# Patient Record
Sex: Male | Born: 1955 | Race: White | Hispanic: No | Marital: Married | Smoking: Never smoker
Health system: Southern US, Community
[De-identification: ages and names within clinical notes are randomized; demographics above are authoritative.]

## PROBLEM LIST (undated history)

## (undated) DIAGNOSIS — G250 Essential tremor: Secondary | ICD-10-CM

## (undated) DIAGNOSIS — I10 Essential (primary) hypertension: Secondary | ICD-10-CM

## (undated) DIAGNOSIS — E119 Type 2 diabetes mellitus without complications: Secondary | ICD-10-CM

## (undated) DIAGNOSIS — F339 Major depressive disorder, recurrent, unspecified: Secondary | ICD-10-CM

## (undated) DIAGNOSIS — N189 Chronic kidney disease, unspecified: Secondary | ICD-10-CM

## (undated) DIAGNOSIS — I509 Heart failure, unspecified: Secondary | ICD-10-CM

## (undated) DIAGNOSIS — F411 Generalized anxiety disorder: Secondary | ICD-10-CM

---

## 2018-10-21 ENCOUNTER — Other Ambulatory Visit: Payer: Self-pay

## 2018-10-21 ENCOUNTER — Emergency Department
Admission: EM | Admit: 2018-10-21 | Discharge: 2018-10-21 | Disposition: A | Discharge status: Home / Self Care | Payer: 59 | Attending: Emergency Medicine | Admitting: Emergency Medicine

## 2018-10-21 DIAGNOSIS — R41840 Attention and concentration deficit: Secondary | ICD-10-CM | POA: Diagnosis not present

## 2018-10-21 DIAGNOSIS — R4182 Altered mental status, unspecified: Secondary | ICD-10-CM | POA: Diagnosis present

## 2018-10-21 HISTORY — DX: Type 2 diabetes mellitus without complications: E11.9

## 2018-10-21 HISTORY — DX: Chronic kidney disease, unspecified: N18.9

## 2018-10-21 HISTORY — DX: Essential tremor: G25.0

## 2018-10-21 HISTORY — DX: Heart failure, unspecified: I50.9

## 2018-10-21 HISTORY — DX: Major depressive disorder, recurrent, unspecified: F33.9

## 2018-10-21 HISTORY — DX: Essential (primary) hypertension: I10

## 2018-10-21 HISTORY — DX: Generalized anxiety disorder: F41.1

## 2018-10-21 LAB — BASIC METABOLIC PANEL
Anion gap: 14 (ref 5–15)
BUN: 32 mg/dL — ABNORMAL HIGH (ref 8–23)
CO2: 26 mmol/L (ref 22–32)
Calcium: 8.7 mg/dL — ABNORMAL LOW (ref 8.9–10.3)
Chloride: 96 mmol/L — ABNORMAL LOW (ref 98–111)
Creatinine, Ser: 1.99 mg/dL — ABNORMAL HIGH (ref 0.61–1.24)
GFR calc Af Amer: 40 mL/min — ABNORMAL LOW (ref >60)
GFR calc non Af Amer: 34 mL/min — ABNORMAL LOW (ref >60)
Glucose, Bld: 92 mg/dL (ref 70–99)
Potassium: 3.8 mmol/L (ref 3.5–5.1)
Sodium: 136 mmol/L (ref 135–145)

## 2018-10-21 LAB — CBC WITH DIFFERENTIAL/PLATELET
Abs Immature Granulocytes: 0.02 10*3/uL (ref 0.00–0.07)
Basophils Absolute: 0 10*3/uL (ref 0.0–0.1)
Basophils Relative: 0 %
Eosinophils Absolute: 0.1 10*3/uL (ref 0.0–0.5)
Eosinophils Relative: 2 %
HCT: 37.5 % — ABNORMAL LOW (ref 39.0–52.0)
Hemoglobin: 12.4 g/dL — ABNORMAL LOW (ref 13.0–17.0)
Immature Granulocytes: 0 %
Lymphocytes Relative: 26 %
Lymphs Abs: 1.9 10*3/uL (ref 0.7–4.0)
MCH: 30 pg (ref 26.0–34.0)
MCHC: 33.1 g/dL (ref 30.0–36.0)
MCV: 90.6 fL (ref 80.0–100.0)
Monocytes Absolute: 0.8 10*3/uL (ref 0.1–1.0)
Monocytes Relative: 11 %
Neutro Abs: 4.3 10*3/uL (ref 1.7–7.7)
Neutrophils Relative %: 61 %
Platelets: 209 10*3/uL (ref 150–400)
RBC: 4.14 MIL/uL — ABNORMAL LOW (ref 4.22–5.81)
RDW: 12.8 % (ref 11.5–15.5)
WBC: 7.2 10*3/uL (ref 4.0–10.5)
nRBC: 0 % (ref 0.0–0.2)

## 2018-10-21 NOTE — ED Provider Notes (Signed)
[facility] Emergency Department Provider Note   ____________________________________________   I have reviewed the triage vital signs and the nursing notes.   HISTORY  Chief Complaint Abnormal behavior  History limited by: Not Limited   HPI Nicholas Morgan is a 63 y.o. male who presents to the emergency department today because of concern for altered mental status and increased swelling. The patient denies any complaints. He says that the bump on his head is from birth and that at times it will get larger. He denies any discomfort. Staff also noticed that when they were talking to the patient he would occasionally have episodes where he would seem to not respond for a couple of seconds. Patient himself does not recall any lapses in concentration.  Records reviewed. Per medical record review patient has a history of diabetes  No past medical history on file.  There are no active problems to display for this patient.   Prior to Admission medications   Not on File    Allergies Patient has no allergy information on record.  No family history on file.  Social History Social History   Tobacco Use  . Smoking status: Not on file  Substance Use Topics  . Alcohol use: Not on file  . Drug use: Not on file    Review of Systems Constitutional: No fever/chills Eyes: No visual changes. ENT: No sore throat. Cardiovascular: Denies chest pain. Respiratory: Denies shortness of breath. Gastrointestinal: No abdominal pain.  No nausea, no vomiting.  No diarrhea.   Genitourinary: Negative for dysuria. Musculoskeletal: Negative for back pain. Skin: Negative for rash. Neurological: Negative for headaches, focal weakness or numbness.  ____________________________________________   PHYSICAL EXAM:  VITAL SIGNS: ED Triage Vitals  Enc Vitals Group     BP 10/21/18 0213 140/85     Pulse Rate 10/21/18 0215 (!) 54     Resp 10/21/18 0216 20     Temp 10/21/18 0216  97.6 F (36.4 C)     Temp Source 10/21/18 0216 Oral     SpO2 10/21/18 0215 97 %     Weight 10/21/18 0217 (!) 307 lb 15.7 oz (139.7 kg)     Height 10/21/18 0217 6\' 1"  (1.854 m)     Head Circumference --      Peak Flow --      Pain Score 10/21/18 0219 0   Constitutional: Alert and oriented.  Eyes: Conjunctivae are normal.  ENT      Head: Normocephalic and atraumatic.      Nose: No congestion/rhinnorhea.      Mouth/Throat: Mucous membranes are moist.      Neck: No stridor. Hematological/Lymphatic/Immunilogical: No cervical lymphadenopathy. Cardiovascular: Normal rate, regular rhythm.  No murmurs, rubs, or gallops.  Respiratory: Normal respiratory effort without tachypnea nor retractions. Breath sounds are clear and equal bilaterally. No wheezes/rales/rhonchi. Gastrointestinal: Soft and non tender. No rebound. No guarding.  Genitourinary: Deferred Musculoskeletal: Normal range of motion in all extremities. No lower extremity edema. Neurologic:  Normal speech and language. No gross focal neurologic deficits are appreciated.  Skin:  Skin is warm, dry and intact. No rash noted. Psychiatric: Mood and affect are normal. Speech and behavior are normal. Patient exhibits appropriate insight and judgment.  ____________________________________________    LABS (pertinent positives/negatives)  CBC wbc 7.2, hgb 12.4, plt 209 BMP na 136, k 3.8, cr 1.99 ____________________________________________   EKG  None  ____________________________________________    RADIOLOGY  None  ____________________________________________   PROCEDURES  Procedures  ____________________________________________  INITIAL IMPRESSION / ASSESSMENT AND PLAN / ED COURSE  Pertinent labs & imaging results that were available during my care of the patient were reviewed by me and considered in my medical decision making (see chart for details).   Patient presents to the emergency department today because  of concern for brief sounding lapses in concentration. Patient himself denies any complaints. Blood work was checked which showed elevation of creatinine but in review of care everywhere this appears to be patient's baseline. Patient himself has no complaints and no obvious AMS noted here. Will plan on discharging back to living facility. ____________________________________________   FINAL CLINICAL IMPRESSION(S) / ED DIAGNOSES  Final diagnoses:  Disturbed concentration     Note: This dictation was prepared with Dragon dictation. Any transcriptional errors that result from this process are unintentional     Phineas SemenGoodman, Jamaury Gumz, MD 10/21/18 [phone]

## 2018-10-21 NOTE — Discharge Instructions (Addendum)
Please seek medical attention for any high fevers, chest pain, shortness of breath, change in behavior, persistent vomiting, bloody stool or any other new or concerning symptoms.  

## 2018-10-21 NOTE — ED Triage Notes (Signed)
Pt arrived via [facility] from [facility] with no complaints. Staff told EMS that the pt had a mark on his right forehead that looks swollen to them and that they were talking to the pt and he would "zone out" and not respond to them but then refocus and talk to them again. Pt denies any pain. Tremors are normal for pt. Pt also has a metal plate in his head so he cannot have any MRI testing. Pt calm and cooperative.

## 2018-10-29 ENCOUNTER — Emergency Department
Admission: EM | Admit: 2018-10-29 | Discharge: 2018-10-29 | Disposition: A | Discharge status: Home / Self Care | Payer: 59 | Attending: Emergency Medicine | Admitting: Emergency Medicine

## 2018-10-29 ENCOUNTER — Other Ambulatory Visit: Payer: Self-pay

## 2018-10-29 ENCOUNTER — Encounter: Payer: Self-pay | Admitting: Emergency Medicine

## 2018-10-29 ENCOUNTER — Emergency Department: Payer: 59

## 2018-10-29 DIAGNOSIS — Y93E8 Activity, other personal hygiene: Secondary | ICD-10-CM | POA: Diagnosis not present

## 2018-10-29 DIAGNOSIS — W0110XA Fall on same level from slipping, tripping and stumbling with subsequent striking against unspecified object, initial encounter: Secondary | ICD-10-CM | POA: Insufficient documentation

## 2018-10-29 DIAGNOSIS — N189 Chronic kidney disease, unspecified: Secondary | ICD-10-CM | POA: Insufficient documentation

## 2018-10-29 DIAGNOSIS — W19XXXA Unspecified fall, initial encounter: Secondary | ICD-10-CM

## 2018-10-29 DIAGNOSIS — Y999 Unspecified external cause status: Secondary | ICD-10-CM | POA: Diagnosis not present

## 2018-10-29 DIAGNOSIS — I509 Heart failure, unspecified: Secondary | ICD-10-CM | POA: Diagnosis not present

## 2018-10-29 DIAGNOSIS — S0102XA Laceration with foreign body of scalp, initial encounter: Secondary | ICD-10-CM | POA: Diagnosis not present

## 2018-10-29 DIAGNOSIS — Y939 Activity, unspecified: Secondary | ICD-10-CM | POA: Diagnosis not present

## 2018-10-29 DIAGNOSIS — Y929 Unspecified place or not applicable: Secondary | ICD-10-CM | POA: Insufficient documentation

## 2018-10-29 DIAGNOSIS — S0101XA Laceration without foreign body of scalp, initial encounter: Secondary | ICD-10-CM

## 2018-10-29 DIAGNOSIS — I130 Hypertensive heart and chronic kidney disease with heart failure and stage 1 through stage 4 chronic kidney disease, or unspecified chronic kidney disease: Secondary | ICD-10-CM | POA: Diagnosis not present

## 2018-10-29 DIAGNOSIS — S0990XA Unspecified injury of head, initial encounter: Secondary | ICD-10-CM | POA: Diagnosis present

## 2018-10-29 MED ORDER — LIDOCAINE HCL (PF) 1 % IJ SOLN
5.0000 mL | Freq: Once | INTRAMUSCULAR | Status: AC
  Administered 2018-10-29: 5 mL
  Filled 2018-10-29: qty 5

## 2018-10-29 NOTE — ED Notes (Signed)
Pt wife updated regarding pt treatment and d/c status

## 2018-10-29 NOTE — ED Triage Notes (Signed)
Pt arrives via EMS from [facility]. EMS reports fall this am with laceration to the head from hitting head on the wall. No reports of loss of consciousness.  BP 100/58 in route EMS took it again and got 75/49.

## 2018-10-29 NOTE — ED Notes (Signed)
Suture Cart at bedside.  

## 2018-10-29 NOTE — Discharge Instructions (Signed)
Please return to the ED or in urgent care in 1 week for suture removal.  Return to the ED sooner than that for increasing swelling, redness, pain, or fever.

## 2018-10-29 NOTE — ED Notes (Signed)
Report given to facility at this time. Information systems manager states that pt will have to return to facility through EMS. Pt wife states that she was unable to transport pt back to facility as well.

## 2018-10-29 NOTE — ED Provider Notes (Signed)
[facility] Emergency Department Provider Note   ____________________________________________   First MD Initiated Contact with Patient 10/29/18 1217     (approximate)  I have reviewed the triage vital signs and the nursing notes.   HISTORY  Chief Complaint Fall    HPI Nicholas Morgan is a 63 y.o. male with past medical history listed below who presents to the ED following fall.  Patient reports that he was bending over to put on his pants, when he is lost his balance and fell forward onto his head.  He reports hitting his head on the ground, but denies losing consciousness.  He now complains of pain to his frontal scalp as well as to his neck.  He denies any numbness or weakness, is not anticoagulated.        Past Medical History:  Diagnosis Date  . CHF (congestive heart failure) (HCC)   . Chronic kidney disease (CKD)   . Diabetes mellitus without complication (HCC)   . Essential hypertension   . Essential tremor   . Generalized anxiety disorder   . Major depressive disorder, recurrent (HCC)     There are no active problems to display for this patient.   History reviewed. No pertinent surgical history.  Prior to Admission medications   Medication Sig Start Date End Date Taking? Authorizing Provider  amLODipine (NORVASC) 5 MG tablet Take 5 mg by mouth daily.   Yes [provider]  aspirin 81 MG chewable tablet Chew 81 mg by mouth daily.   Yes [provider]  atenolol (TENORMIN) 50 MG tablet Take 50 mg by mouth daily.   Yes [provider]  atorvastatin (LIPITOR) 80 MG tablet Take 80 mg by mouth daily.   Yes [provider]  Brexpiprazole (REXULTI) 1 MG TABS Take 1 mg by mouth at bedtime.   Yes [provider]  buPROPion (WELLBUTRIN XL) 300 MG 24 hr tablet Take 300 mg by mouth daily.   Yes [provider]  cefdinir (OMNICEF) 300 MG capsule Take 300 mg by mouth daily. 10/26/18 11/03/18 Yes  [provider]  Cholecalciferol (VITAMIN D) 50 MCG (2000 UT) CAPS Take 2,000 Units by mouth at bedtime.   Yes [provider]  citalopram (CELEXA) 40 MG tablet Take 40 mg by mouth at bedtime.   Yes [provider]  gabapentin (NEURONTIN) 600 MG tablet Take 600 mg by mouth 2 (two) times daily.   Yes [provider]  losartan (COZAAR) 100 MG tablet Take 100 mg by mouth daily.   Yes [provider]  Omega-3 Fatty Acids (FISH OIL) 1000 MG CAPS Take 1,000 mg by mouth at bedtime.   Yes [provider]  senna (SENOKOT) 8.6 MG TABS tablet Take 1 tablet by mouth daily.   Yes [provider]  tamsulosin (FLOMAX) 0.4 MG CAPS capsule Take 0.4 mg by mouth daily.   Yes [provider]  vitamin B-12 (CYANOCOBALAMIN) 1000 MCG tablet Take 1,000 mcg by mouth at bedtime.   Yes [provider]    Allergies Amantadines and Topamax [topiramate]  History reviewed. No pertinent family history.  Social History Social History   Tobacco Use  . Smoking status: Never Smoker  . Smokeless tobacco: Never Used  Substance Use Topics  . Alcohol use: Not Currently  . Drug use: Never    Review of Systems  Constitutional: No fever/chills Eyes: No visual changes. ENT: No sore throat. Cardiovascular: Denies chest pain. Respiratory: Denies shortness of breath.  Gastrointestinal: No abdominal pain.  No nausea, no vomiting.  No diarrhea.  No constipation. Genitourinary: Negative for dysuria. Musculoskeletal: Negative for back pain. Skin: Negative for rash.  Positive for scalp wound. Neurological: Positive for headache, negative for focal weakness or numbness.  ____________________________________________   PHYSICAL EXAM:  VITAL SIGNS: ED Triage Vitals [10/29/18 1211]  Enc Vitals Group     BP      Pulse      Resp      Temp      Temp src      SpO2      Weight 300 lb (136.1 kg)     Height 6\' 1"  (1.854 m)     Head Circumference       Peak Flow      Pain Score 8     Pain Loc      Pain Edu?      Excl. in GC?     Constitutional: Alert and oriented. Eyes: Conjunctivae are normal. Head: Approximately 2 cm laceration to frontal scalp with no associated hematoma or step-off.  No facial bony tenderness. Nose: No congestion/rhinnorhea. Mouth/Throat: Mucous membranes are moist. Neck: Normal ROM, midline cervical spine tenderness. Cardiovascular: Normal rate, regular rhythm. Grossly normal heart sounds. Respiratory: Normal respiratory effort.  No retractions. Lungs CTAB. Gastrointestinal: Soft and nontender. No distention. Genitourinary: deferred Musculoskeletal: No lower extremity tenderness nor edema. Neurologic:  Normal speech and language. No gross focal neurologic deficits are appreciated. Skin:  Skin is warm, dry and intact. No rash noted. Psychiatric: Mood and affect are normal. Speech and behavior are normal.  ____________________________________________   LABS (all labs ordered are listed, but only abnormal results are displayed)  Labs Reviewed - No data to display   PROCEDURES  Procedure(s) performed (including Critical Care):  Marland KitchenMarland KitchenLaceration Repair  Date/Time: 10/29/2018 4:55 PM Performed by: Chesley Noon, MD Authorized by: Chesley Noon, MD   Consent:    Consent obtained:  Verbal   Consent given by:  Patient Anesthesia (see MAR for exact dosages):    Anesthesia method:  Local infiltration   Local anesthetic:  Lidocaine 1% w/o epi Laceration details:    Location:  Scalp   Scalp location:  Frontal   Length (cm):  2 Repair type:    Repair type:  Simple Pre-procedure details:    Preparation:  Patient was prepped and draped in usual sterile fashion Exploration:    Contaminated: no   Treatment:    Area cleansed with:  Saline   Amount of cleaning:  Standard   Irrigation solution:  Sterile water   Irrigation method:  Syringe   Visualized foreign bodies/material removed: no   Skin  repair:    Repair method:  Sutures   Suture size:  5-0   Suture material:  Nylon   Number of sutures:  4 Approximation:    Approximation:  Close Post-procedure details:    Dressing:  Non-adherent dressing     ____________________________________________   INITIAL IMPRESSION / ASSESSMENT AND PLAN / ED COURSE       63 year old male presents to the ED following mechanical fall onto his head while attempting to put on pants.  Head CT and C-spine CT negative for acute process.  Patient does have small laceration to his frontal scalp that was repaired without difficulty.  Counseled patient on need to have his stitches removed in approximately 1 week.  No other apparent traumatic injuries related to patient's fall, will discharge home with PCP follow-up.  ____________________________________________   FINAL CLINICAL IMPRESSION(S) / ED DIAGNOSES  Final diagnoses:  Fall, initial encounter  Laceration of scalp, initial encounter     ED Discharge Orders    None       Note:  This document was prepared using Dragon voice recognition software and may include unintentional dictation errors.   Blake Divine, MD 10/29/18 1655

## 2019-04-30 ENCOUNTER — Other Ambulatory Visit: Payer: Self-pay

## 2019-04-30 ENCOUNTER — Emergency Department
Admission: EM | Admit: 2019-04-30 | Discharge: 2019-05-02 | Disposition: A | Discharge status: Other Acute Inpatient Hospital | Payer: Medicare Other | Attending: Emergency Medicine | Admitting: Emergency Medicine

## 2019-04-30 ENCOUNTER — Emergency Department: Payer: Medicare Other

## 2019-04-30 ENCOUNTER — Encounter: Payer: Self-pay | Admitting: Emergency Medicine

## 2019-04-30 DIAGNOSIS — Z20822 Contact with and (suspected) exposure to covid-19: Secondary | ICD-10-CM | POA: Insufficient documentation

## 2019-04-30 DIAGNOSIS — I130 Hypertensive heart and chronic kidney disease with heart failure and stage 1 through stage 4 chronic kidney disease, or unspecified chronic kidney disease: Secondary | ICD-10-CM | POA: Diagnosis not present

## 2019-04-30 DIAGNOSIS — F411 Generalized anxiety disorder: Secondary | ICD-10-CM | POA: Diagnosis not present

## 2019-04-30 DIAGNOSIS — I509 Heart failure, unspecified: Secondary | ICD-10-CM | POA: Diagnosis not present

## 2019-04-30 DIAGNOSIS — E1122 Type 2 diabetes mellitus with diabetic chronic kidney disease: Secondary | ICD-10-CM | POA: Insufficient documentation

## 2019-04-30 DIAGNOSIS — R45851 Suicidal ideations: Secondary | ICD-10-CM | POA: Diagnosis not present

## 2019-04-30 DIAGNOSIS — Z79899 Other long term (current) drug therapy: Secondary | ICD-10-CM | POA: Diagnosis not present

## 2019-04-30 DIAGNOSIS — Z7984 Long term (current) use of oral hypoglycemic drugs: Secondary | ICD-10-CM | POA: Insufficient documentation

## 2019-04-30 DIAGNOSIS — N189 Chronic kidney disease, unspecified: Secondary | ICD-10-CM | POA: Insufficient documentation

## 2019-04-30 DIAGNOSIS — F322 Major depressive disorder, single episode, severe without psychotic features: Secondary | ICD-10-CM | POA: Insufficient documentation

## 2019-04-30 LAB — COMPREHENSIVE METABOLIC PANEL
ALT: 17 U/L (ref 0–44)
AST: 15 U/L (ref 15–41)
Albumin: 3.7 g/dL (ref 3.5–5.0)
Alkaline Phosphatase: 36 U/L — ABNORMAL LOW (ref 38–126)
Anion gap: 10 (ref 5–15)
BUN: 27 mg/dL — ABNORMAL HIGH (ref 8–23)
CO2: 25 mmol/L (ref 22–32)
Calcium: 9.2 mg/dL (ref 8.9–10.3)
Chloride: 104 mmol/L (ref 98–111)
Creatinine, Ser: 2.06 mg/dL — ABNORMAL HIGH (ref 0.61–1.24)
GFR calc Af Amer: 38 mL/min — ABNORMAL LOW (ref >60)
GFR calc non Af Amer: 33 mL/min — ABNORMAL LOW (ref >60)
Glucose, Bld: 107 mg/dL — ABNORMAL HIGH (ref 70–99)
Potassium: 4.3 mmol/L (ref 3.5–5.1)
Sodium: 139 mmol/L (ref 135–145)
Total Bilirubin: 0.6 mg/dL (ref 0.3–1.2)
Total Protein: 7.1 g/dL (ref 6.5–8.1)

## 2019-04-30 LAB — CBC WITH DIFFERENTIAL/PLATELET
Abs Immature Granulocytes: 0.03 10*3/uL (ref 0.00–0.07)
Basophils Absolute: 0 10*3/uL (ref 0.0–0.1)
Basophils Relative: 0 %
Eosinophils Absolute: 0.3 10*3/uL (ref 0.0–0.5)
Eosinophils Relative: 3 %
HCT: 40.1 % (ref 39.0–52.0)
Hemoglobin: 13.3 g/dL (ref 13.0–17.0)
Immature Granulocytes: 0 %
Lymphocytes Relative: 15 %
Lymphs Abs: 1.1 10*3/uL (ref 0.7–4.0)
MCH: 30.2 pg (ref 26.0–34.0)
MCHC: 33.2 g/dL (ref 30.0–36.0)
MCV: 90.9 fL (ref 80.0–100.0)
Monocytes Absolute: 0.7 10*3/uL (ref 0.1–1.0)
Monocytes Relative: 10 %
Neutro Abs: 5.1 10*3/uL (ref 1.7–7.7)
Neutrophils Relative %: 72 %
Platelets: 247 10*3/uL (ref 150–400)
RBC: 4.41 MIL/uL (ref 4.22–5.81)
RDW: 12.6 % (ref 11.5–15.5)
WBC: 7.3 10*3/uL (ref 4.0–10.5)
nRBC: 0 % (ref 0.0–0.2)

## 2019-04-30 LAB — URINE DRUG SCREEN, QUALITATIVE (ARMC ONLY)
Amphetamines, Ur Screen: NOT DETECTED
Barbiturates, Ur Screen: NOT DETECTED
Benzodiazepine, Ur Scrn: NOT DETECTED
Cannabinoid 50 Ng, Ur ~~LOC~~: NOT DETECTED
Cocaine Metabolite,Ur ~~LOC~~: NOT DETECTED
MDMA (Ecstasy)Ur Screen: NOT DETECTED
Methadone Scn, Ur: NOT DETECTED
Opiate, Ur Screen: NOT DETECTED
Phencyclidine (PCP) Ur S: NOT DETECTED
Tricyclic, Ur Screen: NOT DETECTED

## 2019-04-30 LAB — URINALYSIS, COMPLETE (UACMP) WITH MICROSCOPIC
Bacteria, UA: NONE SEEN
Bilirubin Urine: NEGATIVE
Glucose, UA: NEGATIVE mg/dL
Hgb urine dipstick: NEGATIVE
Ketones, ur: NEGATIVE mg/dL
Nitrite: NEGATIVE
Protein, ur: NEGATIVE mg/dL
Specific Gravity, Urine: 1.01 (ref 1.005–1.030)
pH: 6 (ref 5.0–8.0)

## 2019-04-30 LAB — TSH: TSH: 2.088 u[IU]/mL (ref 0.350–4.500)

## 2019-04-30 LAB — ETHANOL: Alcohol, Ethyl (B): <10 mg/dL (ref <10)

## 2019-04-30 LAB — T4, FREE: Free T4: 1 ng/dL (ref 0.61–1.12)

## 2019-04-30 MED ORDER — ATORVASTATIN CALCIUM 20 MG PO TABS
80.0000 mg | ORAL_TABLET | Freq: Every day | ORAL | Status: DC
  Administered 2019-05-01 – 2019-05-02 (×2): 80 mg via ORAL
  Filled 2019-04-30 (×2): qty 4

## 2019-04-30 MED ORDER — SENNA 8.6 MG PO TABS
1.0000 | ORAL_TABLET | Freq: Every day | ORAL | Status: DC
  Administered 2019-05-01 – 2019-05-02 (×2): 8.6 mg via ORAL
  Filled 2019-04-30 (×2): qty 1

## 2019-04-30 MED ORDER — TAMSULOSIN HCL 0.4 MG PO CAPS: 0.4000 mg | ORAL_CAPSULE | Freq: Every day | ORAL | Status: DC

## 2019-04-30 MED ORDER — CITALOPRAM HYDROBROMIDE 20 MG PO TABS
40.0000 mg | ORAL_TABLET | Freq: Every day | ORAL | Status: DC
  Administered 2019-04-30 – 2019-05-01 (×2): 40 mg via ORAL
  Filled 2019-04-30 (×2): qty 2

## 2019-04-30 MED ORDER — ASPIRIN 81 MG PO CHEW
81.0000 mg | CHEWABLE_TABLET | Freq: Every day | ORAL | Status: DC
  Administered 2019-05-01 – 2019-05-02 (×2): 81 mg via ORAL
  Filled 2019-04-30 (×2): qty 1

## 2019-04-30 MED ORDER — TAMSULOSIN HCL 0.4 MG PO CAPS
0.4000 mg | ORAL_CAPSULE | Freq: Every day | ORAL | Status: DC
  Administered 2019-05-01 – 2019-05-02 (×2): 0.4 mg via ORAL
  Filled 2019-04-30 (×2): qty 1

## 2019-04-30 MED ORDER — ATENOLOL 25 MG PO TABS
50.0000 mg | ORAL_TABLET | Freq: Every day | ORAL | Status: DC
  Administered 2019-05-01: 11:00:00 50 mg via ORAL
  Filled 2019-04-30 (×2): qty 2

## 2019-04-30 MED ORDER — LOSARTAN POTASSIUM 50 MG PO TABS
100.0000 mg | ORAL_TABLET | Freq: Every day | ORAL | Status: DC
  Administered 2019-05-01 – 2019-05-02 (×2): 100 mg via ORAL
  Filled 2019-04-30 (×2): qty 2

## 2019-04-30 MED ORDER — OLANZAPINE 5 MG PO TABS
2.5000 mg | ORAL_TABLET | Freq: Once | ORAL | Status: AC
  Administered 2019-04-30: 12:00:00 2.5 mg via ORAL
  Filled 2019-04-30: qty 1

## 2019-04-30 MED ORDER — AMLODIPINE BESYLATE 5 MG PO TABS
5.0000 mg | ORAL_TABLET | Freq: Every day | ORAL | Status: DC
  Administered 2019-05-01 – 2019-05-02 (×2): 5 mg via ORAL
  Filled 2019-04-30 (×2): qty 1

## 2019-04-30 MED ORDER — GABAPENTIN 600 MG PO TABS
600.0000 mg | ORAL_TABLET | Freq: Two times a day (BID) | ORAL | Status: DC
  Administered 2019-04-30 – 2019-05-02 (×4): 600 mg via ORAL
  Filled 2019-04-30 (×4): qty 1

## 2019-04-30 MED ORDER — BUPROPION HCL ER (XL) 150 MG PO TB24
300.0000 mg | ORAL_TABLET | Freq: Every day | ORAL | Status: DC
  Administered 2019-04-30 – 2019-05-02 (×3): 300 mg via ORAL
  Filled 2019-04-30 (×3): qty 2

## 2019-04-30 MED ORDER — ATORVASTATIN CALCIUM 20 MG PO TABS: 80.0000 mg | ORAL_TABLET | Freq: Every day | ORAL | Status: DC

## 2019-04-30 MED ORDER — BUPROPION HCL ER (XL) 150 MG PO TB24: 300.0000 mg | ORAL_TABLET | Freq: Every day | ORAL | Status: DC

## 2019-04-30 MED ORDER — SENNA 8.6 MG PO TABS: 1.0000 | ORAL_TABLET | Freq: Every day | ORAL | Status: DC

## 2019-04-30 MED ORDER — OLANZAPINE 5 MG PO TABS
2.5000 mg | ORAL_TABLET | Freq: Two times a day (BID) | ORAL | Status: DC
  Administered 2019-04-30 – 2019-05-02 (×4): 2.5 mg via ORAL
  Filled 2019-04-30 (×4): qty 1

## 2019-04-30 NOTE — ED Notes (Signed)
Patient ok to remain in his clothes at this time per Dr. Mayford Knife. Curtain open and patient able to be visualized at nurses station.

## 2019-04-30 NOTE — ED Provider Notes (Signed)
[facility] Emergency Department Provider Note       Time seen: ----------------------------------------- 7:23 AM on 04/30/2019 -----------------------------------------   I have reviewed the triage vital signs and the nursing notes.  HISTORY Chief Complaint Suicidal   HPI Nicholas Morgan is a 64 y.o. male with a history of CHF, CKD, diabetes, major depressive disorder who presents to the ED for reported suicide attempt.  Patient had a belt that he was trying to use to hang himself in the shower.  At this facility they have been locked down again due to Covid and the lockdown does have depressed him.  Patient states he was trying to harm himself.  Past Medical History:  Diagnosis Date  . CHF (congestive heart failure) (HCC)   . Chronic kidney disease (CKD)   . Diabetes mellitus without complication (HCC)   . Essential hypertension   . Essential tremor   . Generalized anxiety disorder   . Major depressive disorder, recurrent (HCC)     There are no problems to display for this patient.   No past surgical history on file.  Allergies Amantadines and Topamax [topiramate]  Social History Social History   Tobacco Use  . Smoking status: Never Smoker  . Smokeless tobacco: Never Used  Substance Use Topics  . Alcohol use: Not Currently  . Drug use: Never    Review of Systems Constitutional: Negative for fever. Cardiovascular: Negative for chest pain. Respiratory: Negative for shortness of breath. Gastrointestinal: Negative for abdominal pain, vomiting and diarrhea. Musculoskeletal: Negative for back pain. Skin: Negative for rash. Neurological: Negative for headaches, focal weakness or numbness. Psychiatric: Positive for depression, suicide attempt  All systems negative/normal/unremarkable except as stated in the HPI  ____________________________________________   PHYSICAL EXAM:  VITAL SIGNS: ED Triage Vitals  Enc Vitals Group     BP       Pulse      Resp      Temp      Temp src      SpO2      Weight      Height      Head Circumference      Peak Flow      Pain Score      Pain Loc      Pain Edu?      Excl. in GC?     Constitutional: Alert and oriented.  No acute distress Eyes: Conjunctivae are normal. Normal extraocular movements. ENT      Head: Normocephalic and atraumatic.      Nose: No congestion/rhinnorhea.      Mouth/Throat: Mucous membranes are moist.      Neck: No stridor. Cardiovascular: Normal rate, regular rhythm. No murmurs, rubs, or gallops. Respiratory: Normal respiratory effort without tachypnea nor retractions. Breath sounds are clear and equal bilaterally. No wheezes/rales/rhonchi. Gastrointestinal: Soft and nontender. Normal bowel sounds Musculoskeletal: Nontender with normal range of motion in extremities. No lower extremity tenderness nor edema. Neurologic:  Generalized weakness, tremors noted Skin:  Skin is warm, dry and intact. No rash noted. Psychiatric: Depressed and tearful ____________________________________________  ED COURSE:  As part of my medical decision making, I reviewed the following data within the electronic MEDICAL RECORD NUMBER History obtained from family if available, nursing notes, old chart and ekg, as well as notes from prior ED visits. Patient presented for suicide attempt, we will assess with labs and imaging as indicated at this time.   Procedures  Hezikiah Retzloff was evaluated in Emergency Department on 04/30/2019 for  the symptoms described in the history of present illness. He was evaluated in the context of the global COVID-19 pandemic, which necessitated consideration that the patient might be at risk for infection with the SARS-CoV-2 virus that causes COVID-19. Institutional protocols and algorithms that pertain to the evaluation of patients at risk for COVID-19 are in a state of rapid change based on information released by regulatory bodies including the CDC and federal  and state organizations. These policies and algorithms were followed during the patient's care in the ED.  ____________________________________________   LABS (pertinent positives/negatives)  Labs Reviewed  COMPREHENSIVE METABOLIC PANEL - Abnormal; Notable for the following components:      Result Value   Glucose, Bld 107 (*)    BUN 27 (*)    Creatinine, Ser 2.06 (*)    Alkaline Phosphatase 36 (*)    GFR calc non Af Amer 33 (*)    GFR calc Af Amer 38 (*)    All other components within normal limits  CBC WITH DIFFERENTIAL/PLATELET  ETHANOL  TSH  T4, FREE  URINALYSIS, COMPLETE (UACMP) WITH MICROSCOPIC  URINE DRUG SCREEN, QUALITATIVE ([facility] ONLY)  CBG MONITORING, ED  ____________________________________________   DIFFERENTIAL DIAGNOSIS   Depression, suicidal ideation, occult infection, medication side effect, hypothyroidism  FINAL ASSESSMENT AND PLAN  Depression, suicidal thoughts   Plan: The patient had presented for depression with suicidal ideation. Patient's labs did reveal chronic kidney disease that is unchanged from prior, otherwise no acute process.  Overall I do not think he needs involuntary commitment.  I will discuss with psychiatry for possible admission versus medication adjustment.   Laurence Aly, MD    Note: This note was generated in part or whole with voice recognition software. Voice recognition is usually quite accurate but there are transcription errors that can and very often do occur. I apologize for any typographical errors that were not detected and corrected.     Earleen Newport, MD 04/30/19 [phone]

## 2019-04-30 NOTE — ED Notes (Signed)
Pt given lunch tray.

## 2019-04-30 NOTE — ED Notes (Signed)
Patient given cola. No further needs expressed at this time.

## 2019-04-30 NOTE — ED Notes (Signed)
Psychiatry at bedside.

## 2019-04-30 NOTE — ED Notes (Signed)
Vol patient to be evaluated again in am possible inpatient beh.

## 2019-04-30 NOTE — BH Assessment (Signed)
Assessment Note  Nicholas Morgan is an 64 y.o. male who presents to the ER from his care facility. He was found in his bathroom, attempting to hang his self. He had his gait belt tied in his shower and found in the process of trying follow through with the attempt. Per the report of the patient, he's upset about not being able to see or spend time with his family. The facility is currently restricting visitors due to COVID-19. They were off restrictions for approximately two days before they return to the restrictions. Per the report of the patient's wife, he has dealt with depression and was stable when he was receiving outpatient treatment. He's been with the current facility for approximately a year and hasn't seen a psychiatric provider. He has had one medication change since he's' been there but nothing ongoing. Wife believes, the lack of treatment and current restrictions are the cause of his current mental and emotional state. This is the first time the patient has tried to end his life.  During the interview the patient was calm, cooperative and pleasant. He was able to provide appropriate answers to the questions. He states he wants to return to the facility but continues to voice SI. When asked about protective factors that would keep him from ending his life, he was unable to provide any at this time. Wife is concerned for the patient to return at this time, because he was able to get gait belt on the shower and coming close with ending his life.    Diagnosis: Major Depression  Past Medical History:  Past Medical History:  Diagnosis Date  . CHF (congestive heart failure) (HCC)   . Chronic kidney disease (CKD)   . Diabetes mellitus without complication (HCC)   . Essential hypertension   . Essential tremor   . Generalized anxiety disorder   . Major depressive disorder, recurrent (HCC)     History reviewed. No pertinent surgical history.  Family History: No family history on file.  Social  History:  reports that he has never smoked. He has never used smokeless tobacco. He reports previous alcohol use. He reports that he does not use drugs.  Additional Social History:  Alcohol / Drug Use Pain Medications: See PTA Prescriptions: See PTA Over the Counter: See PTA History of alcohol / drug use?: No history of alcohol / drug abuse Longest period of sobriety (when/how long): n/a  CIWA: CIWA-Ar BP: 132/72 Pulse Rate: 64 COWS:    Allergies:  Allergies  Allergen Reactions  . Amantadine Other (See Comments)    hallucinations Other Reaction: Hallucinations  . Topiramate Other (See Comments)    Other Reaction: Hallucinations  . Amantadines     Home Medications: (Not in a hospital admission)   OB/GYN Status:  No LMP for male patient.  General Assessment Data Location of Assessment: [facility] ED TTS Assessment: In system Is this a Tele or Face-to-Face Assessment?: Face-to-Face Is this an Initial Assessment or a Re-assessment for this encounter?: Initial Assessment Patient Accompanied by:: Other(Wife) Language Other than English: No Living Arrangements: In Assisted Living/Nursing Home (Comment: Name of Nursing Home What gender do you identify as?: Male Marital status: Married Pregnancy Status: No Living Arrangements: Other (Comment)(ASL Facility) Can pt return to current living arrangement?: Yes Admission Status: Voluntary Is patient capable of signing voluntary admission?: Yes Referral Source: Self/Family/Friend Insurance type: Medicare A&B  Medical Screening Exam [facility] Walk-in ONLY) Medical Exam completed: Yes  Crisis Care Plan Living Arrangements: Other (Comment)(ASL Facility) Legal  Guardian: Other:(Self) Name of Psychiatrist: Reports of none Name of Therapist: Reports of none  Education Status Is patient currently in school?: No Is the patient employed, unemployed or receiving disability?: Unemployed, Receiving disability income  Risk to self with the past 6  months Suicidal Ideation: Yes-Currently Present Has patient been a risk to self within the past 6 months prior to admission? : Yes Suicidal Intent: Yes-Currently Present Has patient had any suicidal intent within the past 6 months prior to admission? : Yes Is patient at risk for suicide?: Yes Suicidal Plan?: Yes-Currently Present Has patient had any suicidal plan within the past 6 months prior to admission? : Yes Specify Current Suicidal Plan: Hang his self Access to Means: Yes Specify Access to Suicidal Means: Gait Belt What has been your use of drugs/alcohol within the last 12 months?: Reports of none Previous Attempts/Gestures: No How many times?: 0 Other Self Harm Risks: Current visit is the only time Triggers for Past Attempts: None known Intentional Self Injurious Behavior: None Family Suicide History: Unknown Recent stressful life event(s): Loss (Comment), Recent negative physical changes, Other (Comment) Persecutory voices/beliefs?: No Depression: Yes Depression Symptoms: Fatigue, Isolating, Feeling worthless/self pity, Feeling angry/irritable Substance abuse history and/or treatment for substance abuse?: No Suicide prevention information given to non-admitted patients: Not applicable  Risk to Others within the past 6 months Homicidal Ideation: No Does patient have any lifetime risk of violence toward others beyond the six months prior to admission? : No Thoughts of Harm to Others: No Current Homicidal Intent: No Current Homicidal Plan: No Access to Homicidal Means: No Identified Victim: Reports of none History of harm to others?: No Assessment of Violence: None Noted Violent Behavior Description: Reports of none Does patient have access to weapons?: No Criminal Charges Pending?: No Does patient have a court date: No Is patient on probation?: No  Psychosis Hallucinations: None noted Delusions: None noted  Mental Status Report Appearance/Hygiene: Unremarkable, In  scrubs Eye Contact: Fair Motor Activity: Freedom of movement, Unremarkable Speech: Logical/coherent, Unremarkable Level of Consciousness: Alert Mood: Depressed, Anxious, Pleasant, Helpless Affect: Apathetic, Appropriate to circumstance, Depressed, Sad Anxiety Level: Minimal Thought Processes: Coherent, Relevant Judgement: Unimpaired Orientation: Person, Place, Time, Situation, Appropriate for developmental age Obsessive Compulsive Thoughts/Behaviors: Minimal  Cognitive Functioning Concentration: Normal Memory: Recent Intact, Remote Intact Is patient IDD: No Insight: Fair Impulse Control: Fair Appetite: Good Have you had any weight changes? : No Change Sleep: No Change Total Hours of Sleep: 8 Vegetative Symptoms: None  ADLScreening [facility] Assessment Services) Patient's cognitive ability adequate to safely complete daily activities?: Yes Patient able to express need for assistance with ADLs?: Yes Independently performs ADLs?: No  Prior Inpatient Therapy Prior Inpatient Therapy: No  Prior Outpatient Therapy Prior Outpatient Therapy: Yes Prior Therapy Dates: 2019 Prior Therapy Facilty/Provider(s): Unable to remember the name Reason for Treatment: Depression Does patient have an ACCT team?: No Does patient have [facility] services? : No Does patient have P4CC services?: No  ADL Screening (condition at time of admission) Patient's cognitive ability adequate to safely complete daily activities?: Yes Patient able to express need for assistance with ADLs?: Yes Independently performs ADLs?: No   Abuse/Neglect Assessment (Assessment to be complete while patient is alone) Abuse/Neglect Assessment Can Be Completed: Yes Physical Abuse: Denies Verbal Abuse: Denies Sexual Abuse: Denies Exploitation of patient/patient's resources: Denies Self-Neglect: Denies Values / Beliefs Cultural Requests During Hospitalization: None Spiritual Requests During Hospitalization:  None Consults Spiritual Care Consult Needed: No Transition of Care Team Consult Needed: No  Child/Adolescent Assessment Running Away Risk: Denies(Patient is an adult)  Disposition:  Disposition Initial Assessment Completed for this Encounter: Yes  On Site Evaluation by:   Reviewed with Physician:    Lilyan Gilford MS, LCAS, [facility], NCC Therapeutic Triage Specialist 04/30/2019 2:29 PM

## 2019-04-30 NOTE — ED Triage Notes (Signed)
Patient from [facility] via [facility]. Patient was found in bathroom with a gait belt tied around the shower rod. States he was going to hang himself. Patient states he is overwhelmed and depressed due to being unable to see family and states facility just went back on lock down due to COVID. Patient states he woke up feeling this way but denies previous suicidal ideation. Patient reports some history of depression but denies SI or past attempts.

## 2019-04-30 NOTE — Consult Note (Signed)
[facility] Face-to-Face Psychiatry Consult   Reason for Consult: Suicidal gesture Referring Physician: Dr. Mayford Knife Patient Identification: Nicholas Morgan MRN:  [id number] Principal Diagnosis: <principal problem not specified> Diagnosis:  Active Problems:   * No active hospital problems. *   Total Time spent with patient: 45 minutes  Subjective:   Nicholas Morgan is a 64 y.o. male patient admitted following a suicide attempt.  HPI: Patient is a 64 year old male with a history of major depressive disorder who presents following a suicide attempt.  Per report patient had tied a belt around a shower rod in an attempt to hang himself.  Of note patient is wheelchair-bound and did this in an assisted living facility with minimal supervision.  Patient is still endorsing signs and symptoms of depression due to his living situation.  Patient states that he is chronically ill as well as constantly forced into quarantine at his assisted living facility.  Patient is stating that it was an impulsive act but is vague when asked whether or not he remained suicidal.  Patient is tearful during evaluation and endorses feelings of hopelessness and low mood.  Patient's wife was present at bedside to provide collateral information.  She states that this is a decompensation from his normal state of depression and that she has never seen him this acute.  She is concerned at the level of supervision he will receive at the facility as well as the level of appropriate psychiatric follow-up.  Patient was initially hesitant to stay in the hospital but after conversation with wife was more willing to accept help.  We will initiate Zyprexa as opposed to Abilify and monitor the patient while referring to geriatric psychiatry.  Past Psychiatric History: Patient has history of major depressive disorder.  Has been taking psychiatric medications and does not currently have an outpatient provider as he is living in an assisted living facility.   Recent medications were prescribed by one time visiting doctor.  Risk to Self: Suicidal Ideation: Yes-Currently Present Suicidal Intent: Yes-Currently Present Is patient at risk for suicide?: Yes Suicidal Plan?: Yes-Currently Present Specify Current Suicidal Plan: Hang his self Access to Means: Yes Specify Access to Suicidal Means: Gait Belt What has been your use of drugs/alcohol within the last 12 months?: Reports of none How many times?: 0 Other Self Harm Risks: Current visit is the only time Triggers for Past Attempts: None known Intentional Self Injurious Behavior: None Risk to Others: Homicidal Ideation: No Thoughts of Harm to Others: No Current Homicidal Intent: No Current Homicidal Plan: No Access to Homicidal Means: No Identified Victim: Reports of none History of harm to others?: No Assessment of Violence: None Noted Violent Behavior Description: Reports of none Does patient have access to weapons?: No Criminal Charges Pending?: No Does patient have a court date: No Prior Inpatient Therapy: Prior Inpatient Therapy: No Prior Outpatient Therapy: Prior Outpatient Therapy: Yes Prior Therapy Dates: 2019 Prior Therapy Facilty/Provider(s): Unable to remember the name Reason for Treatment: Depression Does patient have an ACCT team?: No Does patient have [facility] services? : No Does patient have P4CC services?: No  Past Medical History:  Past Medical History:  Diagnosis Date  . CHF (congestive heart failure) (HCC)   . Chronic kidney disease (CKD)   . Diabetes mellitus without complication (HCC)   . Essential hypertension   . Essential tremor   . Generalized anxiety disorder   . Major depressive disorder, recurrent (HCC)    History reviewed. No pertinent surgical history. Family History: No family history  on file. Family Psychiatric  History: Denies Social History:  Social History   Substance and Sexual Activity  Alcohol Use Not Currently     Social History    Substance and Sexual Activity  Drug Use Never    Social History   Socioeconomic History  . Marital status: Married    Spouse name: Not on file  . Number of children: Not on file  . Years of education: Not on file  . Highest education level: Not on file  Occupational History  . Not on file  Tobacco Use  . Smoking status: Never Smoker  . Smokeless tobacco: Never Used  Substance and Sexual Activity  . Alcohol use: Not Currently  . Drug use: Never  . Sexual activity: Not on file  Other Topics Concern  . Not on file  Social History Narrative  . Not on file   Social Determinants of Health   Financial Resource Strain:   . Difficulty of Paying Living Expenses: Not on file  Food Insecurity:   . Worried About Programme researcher, broadcasting/film/video in the Last Year: Not on file  . Ran Out of Food in the Last Year: Not on file  Transportation Needs:   . Lack of Transportation (Medical): Not on file  . Lack of Transportation (Non-Medical): Not on file  Physical Activity:   . Days of Exercise per Week: Not on file  . Minutes of Exercise per Session: Not on file  Stress:   . Feeling of Stress : Not on file  Social Connections:   . Frequency of Communication with Friends and Family: Not on file  . Frequency of Social Gatherings with Friends and Family: Not on file  . Attends Religious Services: Not on file  . Active Member of Clubs or Organizations: Not on file  . Attends Banker Meetings: Not on file  . Marital Status: Not on file   Additional Social History: Patient currently living in assisted living facility.    Allergies:   Allergies  Allergen Reactions  . Amantadine Other (See Comments)    hallucinations Other Reaction: Hallucinations  . Topiramate Other (See Comments)    Other Reaction: Hallucinations  . Amantadines     Labs:  Results for orders placed or performed during the hospital encounter of 04/30/19 (from the past 48 hour(s))  Urinalysis, Complete w  Microscopic     Status: Abnormal   Collection Time: 04/30/19  7:24 AM  Result Value Ref Range   Color, Urine YELLOW (A) YELLOW   APPearance CLEAR (A) CLEAR   Specific Gravity, Urine 1.010 1.005 - 1.030   pH 6.0 5.0 - 8.0   Glucose, UA NEGATIVE NEGATIVE mg/dL   Hgb urine dipstick NEGATIVE NEGATIVE   Bilirubin Urine NEGATIVE NEGATIVE   Ketones, ur NEGATIVE NEGATIVE mg/dL   Protein, ur NEGATIVE NEGATIVE mg/dL   Nitrite NEGATIVE NEGATIVE   Leukocytes,Ua SMALL (A) NEGATIVE   RBC / HPF 0-5 0 - 5 RBC/hpf   WBC, UA 0-5 0 - 5 WBC/hpf   Bacteria, UA NONE SEEN NONE SEEN   Squamous Epithelial / LPF 0-5 0 - 5   Mucus PRESENT     Comment: Performed at [facility], [address], [place], [place] [address]  Urine Drug Screen, Qualitative ([facility] only)     Status: None   Collection Time: 04/30/19  7:24 AM  Result Value Ref Range   Tricyclic, Ur Screen NONE DETECTED NONE DETECTED   Amphetamines, Ur Screen NONE DETECTED NONE  DETECTED   MDMA (Ecstasy)Ur Screen NONE DETECTED NONE DETECTED   Cocaine Metabolite,Ur [facility] NONE DETECTED NONE DETECTED   Opiate, Ur Screen NONE DETECTED NONE DETECTED   Phencyclidine (PCP) Ur S NONE DETECTED NONE DETECTED   Cannabinoid 50 Ng, Ur  NONE DETECTED NONE DETECTED   Barbiturates, Ur Screen NONE DETECTED NONE DETECTED   Benzodiazepine, Ur Scrn NONE DETECTED NONE DETECTED   Methadone Scn, Ur NONE DETECTED NONE DETECTED    Comment: (NOTE) Tricyclics + metabolites, urine    Cutoff 1000 ng/mL Amphetamines + metabolites, urine  Cutoff 1000 ng/mL MDMA (Ecstasy), urine              Cutoff 500 ng/mL Cocaine Metabolite, urine          Cutoff 300 ng/mL Opiate + metabolites, urine        Cutoff 300 ng/mL Phencyclidine (PCP), urine         Cutoff 25 ng/mL Cannabinoid, urine                 Cutoff 50 ng/mL Barbiturates + metabolites, urine  Cutoff 200 ng/mL Benzodiazepine, urine              Cutoff 200 ng/mL Methadone, urine                   Cutoff 300 ng/mL The  urine drug screen provides only a preliminary, unconfirmed analytical test result and should not be used for non-medical purposes. Clinical consideration and professional judgment should be applied to any positive drug screen result due to possible interfering substances. A more specific alternate chemical method must be used in order to obtain a confirmed analytical result. Gas chromatography / mass spectrometry (GC/MS) is the preferred confirmat ory method. Performed at [facility], [address] [place], [place] [address]   CBC with Differential     Status: None   Collection Time: 04/30/19  7:29 AM  Result Value Ref Range   WBC 7.3 4.0 - 10.5 K/uL   RBC 4.41 4.22 - 5.81 MIL/uL   Hemoglobin 13.3 13.0 - 17.0 g/dL   HCT 17.6 16.0 - 73.7 %   MCV 90.9 80.0 - 100.0 fL   MCH 30.2 26.0 - 34.0 pg   MCHC 33.2 30.0 - 36.0 g/dL   RDW 10.6 26.9 - 48.5 %   Platelets 247 150 - 400 K/uL   nRBC 0.0 0.0 - 0.2 %   Neutrophils Relative % 72 %   Neutro Abs 5.1 1.7 - 7.7 K/uL   Lymphocytes Relative 15 %   Lymphs Abs 1.1 0.7 - 4.0 K/uL   Monocytes Relative 10 %   Monocytes Absolute 0.7 0.1 - 1.0 K/uL   Eosinophils Relative 3 %   Eosinophils Absolute 0.3 0.0 - 0.5 K/uL   Basophils Relative 0 %   Basophils Absolute 0.0 0.0 - 0.1 K/uL   Immature Granulocytes 0 %   Abs Immature Granulocytes 0.03 0.00 - 0.07 K/uL    Comment: Performed at [facility], [address] [place], [place] [address]  Comprehensive metabolic panel     Status: Abnormal   Collection Time: 04/30/19  7:29 AM  Result Value Ref Range   Sodium 139 135 - 145 mmol/L   Potassium 4.3 3.5 - 5.1 mmol/L   Chloride 104 98 - 111 mmol/L   CO2 25 22 - 32 mmol/L   Glucose, Bld 107 (H) 70 - 99 mg/dL    Comment: Glucose reference range applies only to samples  taken after fasting for at least 8 hours.   BUN 27 (H) 8 - 23 mg/dL   Creatinine, Ser 0.96 (H) 0.61 - 1.24 mg/dL   Calcium 9.2 8.9 - 28.3 mg/dL   Total  Protein 7.1 6.5 - 8.1 g/dL   Albumin 3.7 3.5 - 5.0 g/dL   AST 15 15 - 41 U/L   ALT 17 0 - 44 U/L   Alkaline Phosphatase 36 (L) 38 - 126 U/L   Total Bilirubin 0.6 0.3 - 1.2 mg/dL   GFR calc non Af Amer 33 (L) >60 mL/min   GFR calc Af Amer 38 (L) >60 mL/min   Anion gap 10 5 - 15    Comment: Performed at [facility], [address] [place], [place] [address]  Ethanol     Status: None   Collection Time: 04/30/19  7:29 AM  Result Value Ref Range   Alcohol, Ethyl (B) <10 <10 mg/dL    Comment: (NOTE) Lowest detectable limit for serum alcohol is 10 mg/dL. For medical purposes only. Performed at [facility], [address] [place], [place] [address]   TSH     Status: None   Collection Time: 04/30/19  7:29 AM  Result Value Ref Range   TSH 2.088 0.350 - 4.500 uIU/mL    Comment: Performed by a 3rd Generation assay with a functional sensitivity of <=0.01 uIU/mL. Performed at [facility], [address] [place], [place] [address]   T4, free     Status: None   Collection Time: 04/30/19  7:29 AM  Result Value Ref Range   Free T4 1.00 0.61 - 1.12 ng/dL    Comment: (NOTE) Biotin ingestion may interfere with free T4 tests. If the results are inconsistent with the TSH level, previous test results, or the clinical presentation, then consider biotin interference. If needed, order repeat testing after stopping biotin. Performed at [facility], [address] [place], [place] [address]     Current Facility-Administered Medications  Medication Dose Route Frequency Provider Last Rate Last Admin  . buPROPion (WELLBUTRIN XL) 24 hr tablet 300 mg  300 mg Oral Daily Sharhonda Atwood A, MD      . citalopram (CELEXA) tablet 40 mg  40 mg Oral QHS Chue Berkovich A, MD      . gabapentin (NEURONTIN) tablet 600 mg  600 mg Oral BID Louis Gaw, Worthy Rancher, MD       Current Outpatient Medications  Medication Sig Dispense Refill  . amLODipine (NORVASC) 5 MG tablet  Take 5 mg by mouth daily.    . ARIPiprazole (ABILIFY) 2 MG tablet Take 2 mg by mouth at bedtime.    Marland Kitchen aspirin 81 MG chewable tablet Chew 81 mg by mouth daily.    Marland Kitchen atenolol (TENORMIN) 50 MG tablet Take 50 mg by mouth daily.    Marland Kitchen atorvastatin (LIPITOR) 80 MG tablet Take 80 mg by mouth daily.    Marland Kitchen buPROPion (WELLBUTRIN XL) 300 MG 24 hr tablet Take 300 mg by mouth daily.    . Cholecalciferol (VITAMIN D) 50 MCG (2000 UT) CAPS Take 2,000 Units by mouth at bedtime.    . citalopram (CELEXA) 40 MG tablet Take 40 mg by mouth at bedtime.    . gabapentin (NEURONTIN) 600 MG tablet Take 600 mg by mouth 2 (two) times daily.    Marland Kitchen losartan (COZAAR) 100 MG tablet Take 100 mg by mouth daily.    . Omega-3 Fatty Acids (FISH OIL) 1000 MG CAPS Take  1,000 mg by mouth at bedtime.    . senna (SENOKOT) 8.6 MG TABS tablet Take 1 tablet by mouth daily.    . tamsulosin (FLOMAX) 0.4 MG CAPS capsule Take 0.4 mg by mouth daily.    . vitamin B-12 (CYANOCOBALAMIN) 1000 MCG tablet Take 1,000 mcg by mouth at bedtime.    . Brexpiprazole (REXULTI) 1 MG TABS Take 1 mg by mouth at bedtime.      Musculoskeletal: Strength & Muscle Tone: decreased Gait & Station: unable to stand Patient leans: N/A  Psychiatric Specialty Exam: Physical Exam  Review of Systems  Blood pressure 132/72, pulse 64, temperature (!) 97.2 F (36.2 C), temperature source Oral, resp. rate 16, height 6\' 1"  (1.854 m), weight (!) 149.7 kg, SpO2 96 %.Body mass index is 43.54 kg/m.  General Appearance: Disheveled  Eye Contact:  Fair  Speech:  Slow  Volume:  Decreased  Mood:  Anxious and Depressed  Affect:  Blunt, Congruent and Tearful  Thought Process:  Coherent  Orientation:  Full (Time, Place, and Person)  Thought Content:  Logical  Suicidal Thoughts: Yes with recent gesture  Homicidal Thoughts:  No  Memory:  Recent;   Fair  Judgement:  Poor  Insight:  Fair  Psychomotor Activity:  Tremor  Concentration:  Concentration: Fair  Recall:  [employer]  of Knowledge:  Fair  Language:  Fair  Akathisia:  No  Handed:  Right  AIMS (if indicated):     Assets:  Desire for Improvement Housing Intimacy Leisure Time Resilience  ADL's:  Impaired  Cognition:  WNL  Sleep:        Treatment Plan Summary:  64 year old male with history of multiple medical issues including CHF diabetes CKD and DBS, who presents with behavior changes suicidal gesture.  Patient will benefit from inpatient hospitalization for safety, stabilization, medication management.  Medications: For now we have administered 1 dose of Zyprexa 2.5 mg, in addition to his home medications of Wellbutrin 300 mg, Celexa 40 mg, gabapentin 600 twice daily.  Diagnosis: Major depressive disorder severe  Disposition: Recommend psychiatric Inpatient admission when medically cleared.  Dixie Dials, MD 04/30/2019 3:14 PM

## 2019-04-30 NOTE — ED Notes (Signed)
Pt requiring assistance with feeding. Pt ate 100% of meal tray and 12oz diet coke.

## 2019-04-30 NOTE — ED Notes (Signed)
Pt able to feed self pizza from dinner tray. This RN assisted pt in holding cup of water to drink. Pt ate 100% of tray.

## 2019-04-30 NOTE — BH Assessment (Signed)
Referral information for Psychiatric Hospitalization faxed to;   Marland Kitchen [facility] [phone]), declined due to he is unable to complete his ADL.   Marland Kitchen [facility] [phone]- [phone]), declined due to he is unable to complete his ADL.   Marland Kitchen [facility] [phone]),  . [facility] [phone] -[phone]), declined due to he is unable to complete his ADL.   Marland Kitchen [facility] [phone] or [phone]), declined due to he is unable to complete his ADL.   Marland Kitchen [place] [phone] or [phone]),   . [facility] [phone]).

## 2019-04-30 NOTE — ED Notes (Signed)
Per Shanda Bumps, nurse at [facility], patient was taken off of Rexulti and started on Abilify 2 mg on 04/11/2019. This was due to change in insurance coverage of medication. MD made aware.

## 2019-04-30 NOTE — ED Notes (Signed)
Patient is sleeping at this time.

## 2019-05-01 LAB — RESPIRATORY PANEL BY RT PCR (FLU A&B, COVID)
Influenza A by PCR: NEGATIVE
Influenza B by PCR: NEGATIVE
SARS Coronavirus 2 by RT PCR: NEGATIVE

## 2019-05-01 MED ORDER — OLANZAPINE 2.5 MG PO TABS: 2.5000 mg | ORAL_TABLET | Freq: Two times a day (BID) | ORAL | 0 refills | Status: AC

## 2019-05-01 NOTE — ED Provider Notes (Signed)
4:57 PM Patient has been seen and evaluated by psychiatry team and deemed appropriate for discharge. Patient determined not to be a threat to themselves or others.  Psychiatry has initiated olanzapine 2.5 mg twice daily which patient has reacted well to, advised discharge with prescription for this, will provide. Patient is otherwise medically clear and stable for discharge.    Miguel Aschoff., MD 05/01/19 [phone]

## 2019-05-01 NOTE — ED Notes (Signed)
Pt asleep at this time Meal tray sat at bedside.

## 2019-05-01 NOTE — ED Notes (Signed)
[facility] cancelled, pt not to be transported til 2.26.21

## 2019-05-01 NOTE — Discharge Instructions (Addendum)
Thank you for letting us take care of you in the emergency department today.   Please continue to take any regular, prescribed medications.   New medications we have prescribed:  - Olanzapine (Zyprexa) 2.5 mg, twice a day  Please follow up with: - Your primary care doctor and/or outpatient psychiatry doctor  Please return to the ER for any new or worsening symptoms.

## 2019-05-01 NOTE — ED Notes (Signed)
This RN attempted to call [facility] to give report. This RN was told the supervisor would call back and were unable to take report at this time.

## 2019-05-01 NOTE — ED Notes (Signed)
Patient is resting at this time, no complaints. No thoughts of hurting himself or anyone else at this time. Patient finished 100% of dinner tray

## 2019-05-01 NOTE — BH Assessment (Signed)
Writer followed up with referrals:   [facility] [phone] due to he is unable to complete his ADL.   [facility] [phone]- [phone]), declined due to he is unable to complete his ADL.   [facility] ([phone]), No beds   [facility] [phone] -or- [phone]),declined due to he is unable to complete his ADL.   [facility] [phone] or [phone]), declined due to he is unable to complete his ADL.   [facility] [phone] or [phone]), Currently under review with admissions staff Amy   [facility] ([phone]), declined.

## 2019-05-01 NOTE — ED Notes (Signed)
Patient given nighttime meds with water. Patient is cooperative but withdrawn. Asked patient if he would like to be repositioned and he declined. Does not need urinal at this time. Bed alarm turned on for safety, lights dimmed. Law enforcement present.

## 2019-05-01 NOTE — ED Notes (Signed)
Patient given graham crackers and peanut butter, as well as some soda. This RN assisted patient with eating. No complaints

## 2019-05-01 NOTE — ED Notes (Addendum)
This RN spoke with the manager, Samantha [phone]. This RN informed that pt will not be able to come back to facility tonight. Manager stated that pt has to be re-evaluated by their staff, Shanda Bumps that staff member leaves at 5pm. Press photographer, MD and secretary made aware. Pt also made aware.

## 2019-05-01 NOTE — BH Assessment (Signed)
Referral information for Psychiatric Hospitalization faxed to;    [facility] [phone]), declined due to he is unable to complete his ADL.    [facility] [phone]- [phone]), declined due to he is unable to complete his ADL.    [facility] [phone]),   [facility] [phone]), declined due to he is unable to complete his ADL.    [facility] [phone] or [phone]), declined due to he is unable to complete his ADL.    [facility] [phone] or [phone]), Currently under review with admissions staff Amy   [facility] [phone]).

## 2019-05-01 NOTE — ED Notes (Signed)
Report from RN to include Situation, Background, Assessment, and Recommendations received from RN. Patient alert and oriented, warm and dry, in no acute distress. Patient denies SI, HI, AVH and pain. Patient made aware of Q15 minute rounds and Psychologist, counselling presence for their safety. Patient instructed to come to me with needs or concerns.

## 2019-05-01 NOTE — ED Notes (Signed)
[facility] called for transport back to [facility] 

## 2019-05-02 NOTE — ED Notes (Signed)
Attempted twice to call [facility] and see if they have received the faxed documents and are ready to visit pt for eval, no response

## 2019-05-02 NOTE — Consult Note (Signed)
  Patient reevaluated and reports feeling much better.  He states that he is no longer suicidal and hoping to return back to his group home.  Patient attributes his changes to the change in medication which she reports allows him to feel much more calm and is no longer endorsing the impulsive thoughts of harming himself.  This information was discussed with his wife who agreed that given the patient's improvement in condition he would be best suited by returning back to his assisted living facility.  At this time patient no longer meets criteria for inpatient hospitalization due to to the improvement of his condition.  We attribute this change psychiatrically to his change in medication.  Furthermore patient continues to state that he himself is improved.  Plan:  Discontinue Abilify and replace with olanzapine 2.5 mg twice daily (prescription already written) Discharge back to the [facility] facility

## 2019-05-02 NOTE — BH Assessment (Signed)
Writer spoke with the patient to complete an updated/reassessment. Patient denies SI/HI and AV/H.  Patient no longer meet inpatient criteria.  Spoke with [employer]. They are planning to come to the ER to complete a face-to-face assessment.

## 2019-05-02 NOTE — ED Notes (Addendum)
Patient is sleeping, respirations even and unlabored

## 2019-05-02 NOTE — ED Notes (Signed)
Patient is currently sleeping, NAD. No needs at this time

## 2019-05-02 NOTE — ED Notes (Signed)
[facility] called about pt transfer. Per [facility], they need pt treatment record and psych eval faxed to them [phone] before they will come do their assessment and take pt back via their transport.

## 2019-05-02 NOTE — ED Notes (Addendum)
Patient refused repositioning  Urinal at bedside, bed alarm on

## 2019-05-02 NOTE — ED Notes (Signed)
Patient's bed linens changed, gown changed. Patient uses urinal but usually spills, offered him a different method of using the bathroom which he refused. Patient is cooperative and calm, no complaints. Patient repositioned in bed

## 2019-05-02 NOTE — ED Notes (Signed)
Pt reports no auditory/visual hallucinations, and reports no thoughts of harming self or others

## 2021-03-29 IMAGING — CT CT CERVICAL SPINE WITHOUT CONTRAST
4 of 8 series · 12 of 33 positions shown, 13 images · non-contrast
Comparison: None.

CLINICAL DATA: Scalp laceration.  Pain.

EXAM:
CT HEAD WITHOUT CONTRAST
CT CERVICAL SPINE WITHOUT CONTRAST
TECHNIQUE: Multidetector CT imaging of the head and cervical spine was
performed following the standard protocol without intravenous
contrast. Multiplanar CT image reconstructions of the cervical spine
were also generated.

[Series 5: coronal soft tissue · coronal · 0.33mm/px · 2 of 79 slices shown]
[im 27/79  bone]
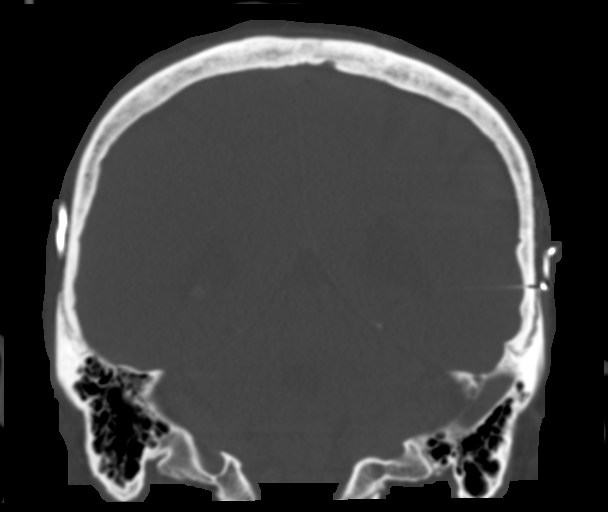
[im 53/79  bone]
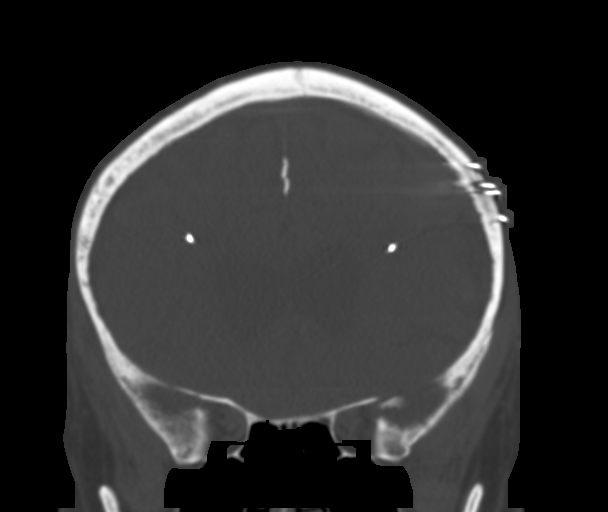

[Series 8: c spine soft · axial · 0.36mm/px · z∈[-291,-245]mm · 2 of 92 slices shown]
[im 23/92  soft-tissue]
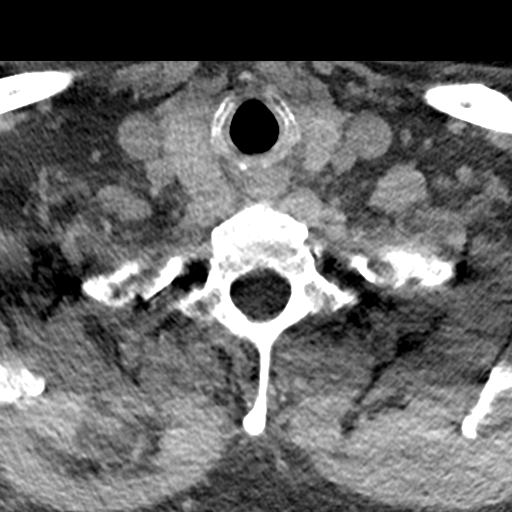
[im 46/92  soft-tissue]
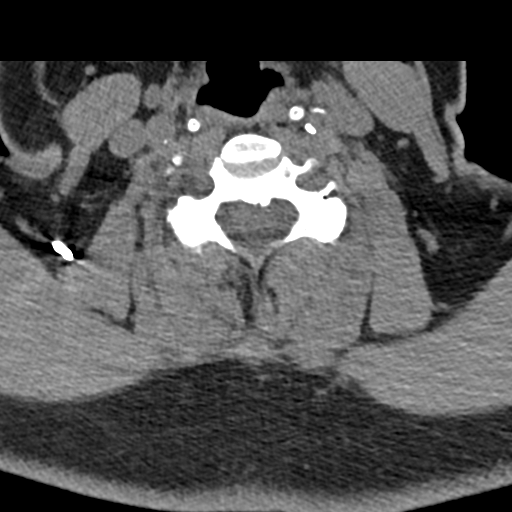

[Series 11: sagittal bone · sagittal · 0.21mm/px · 5 of 74 slices shown]
[im 13/74  bone]
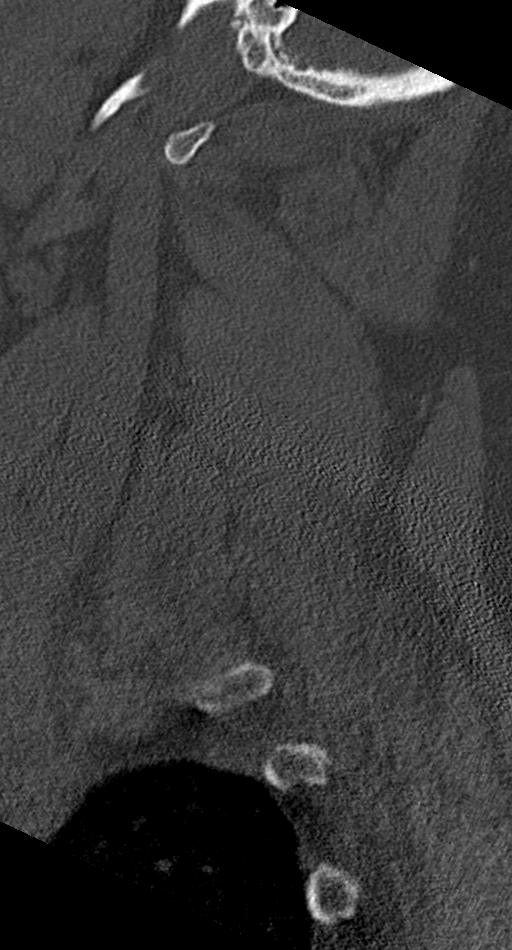
[im 25/74  bone]
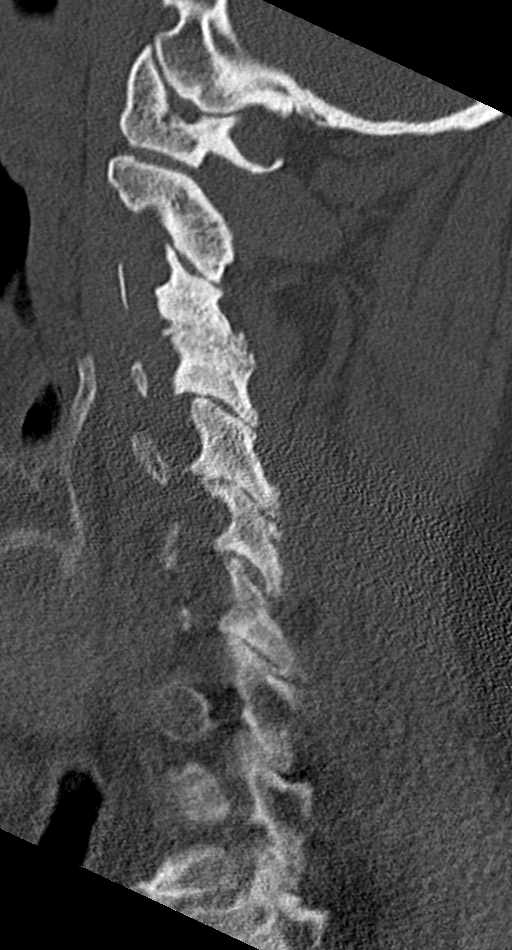
[im 37/74  bone]
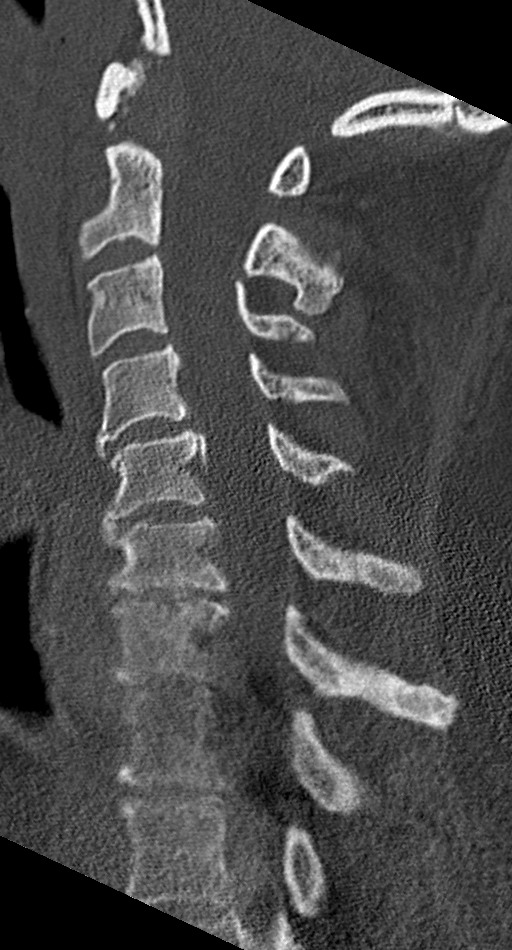
[im 49/74  bone]
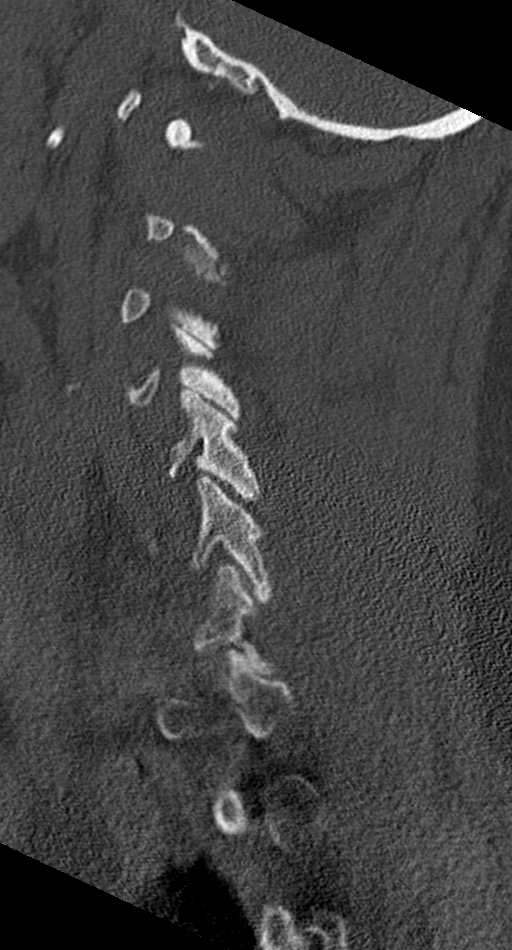
[im 61/74  bone]
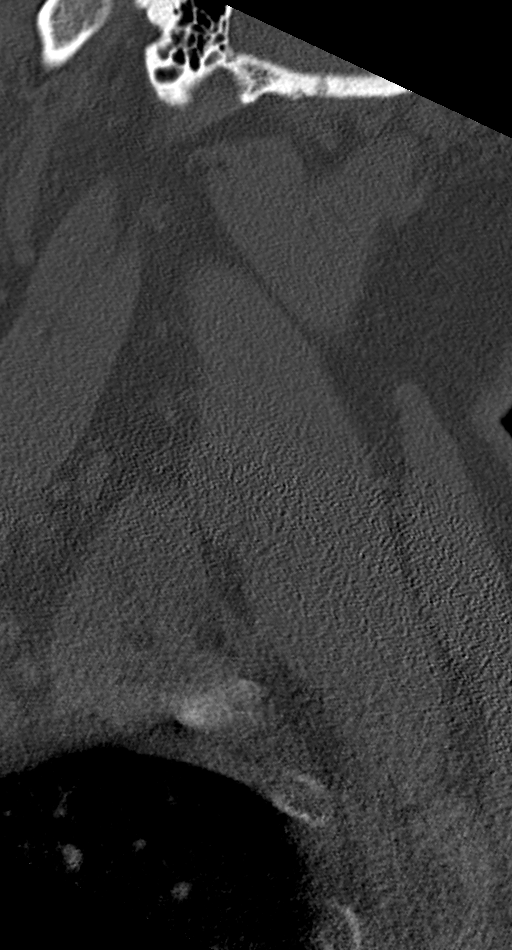

[Series 13: orthogonal bone · axial · 0.21mm/px · z∈[-318,-226]mm · 3 of 103 slices shown, 4 images]
[im 26/103  soft-tissue]
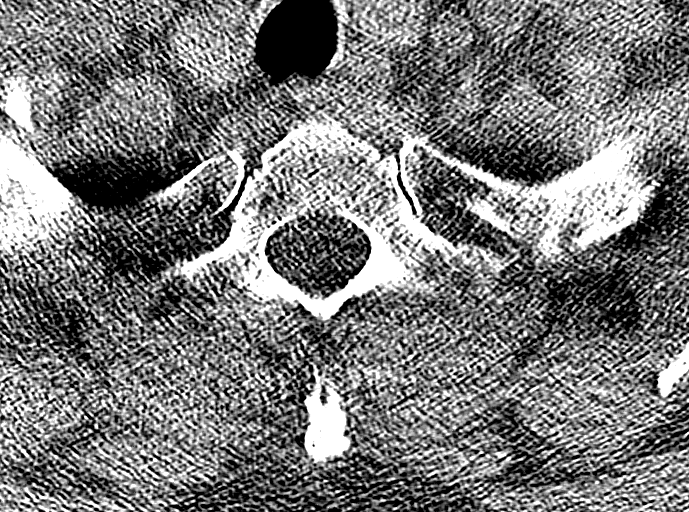
[im 26/103  bone]
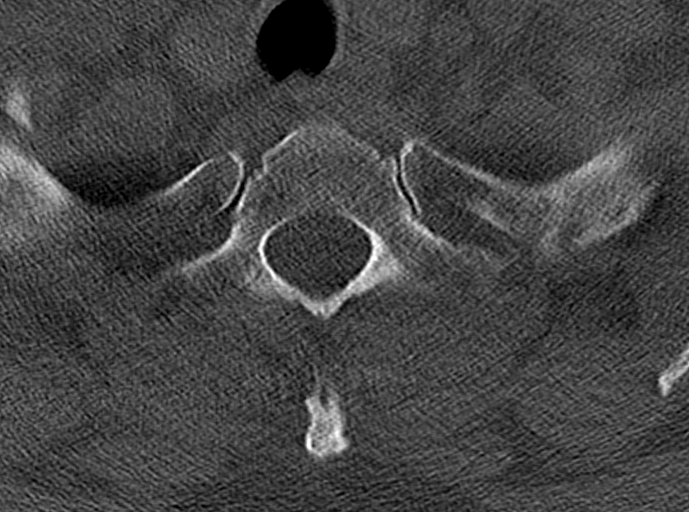
[im 52/103  bone]
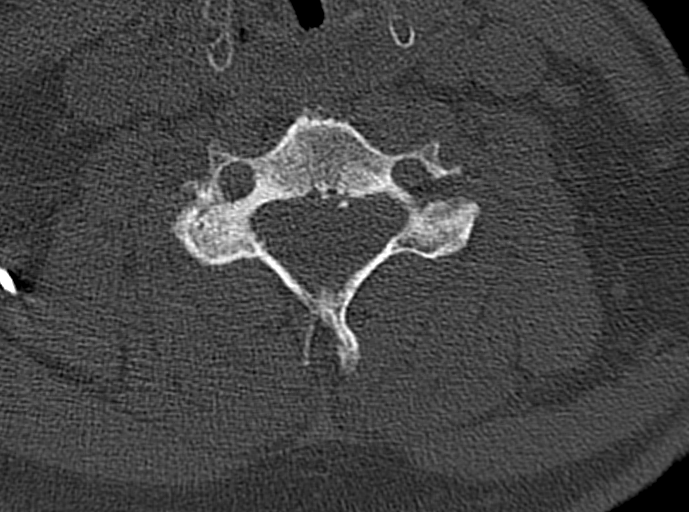
[im 77/103  bone]
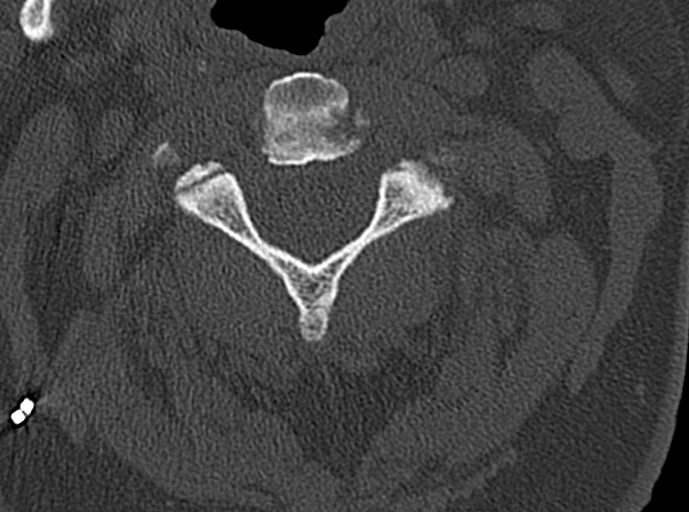

[12 of 33 positions shown; findings below may reference images not displayed]

FINDINGS: CT HEAD FINDINGS

Brain: No evidence of acute infarction, hemorrhage, hydrocephalus,
extra-axial collection or mass lesion/mass effect. Leads from deep
brain stimulator which terminate near the inferior aspect of the
thalami bilaterally. Mild low-density changes in the periventricular
and subcortical white matter suggesting chronic microvascular
ischemic changes.

Vascular: Mild atherosclerotic calcifications involving the large
vessels of the skull base. No unexpected hyperdense vessel.

Skull: No acute fracture or focal bone lesion.

Sinuses/Orbits: No acute finding.

Other: None.

CT CERVICAL SPINE FINDINGS

Alignment: Slight reversal of cervical lordosis centered at C6-7.
minimal anterolisthesis C3 on C4, C4 on C5, and C5 on C6.

Skull base and vertebrae: No acute fracture. No primary bone lesion
or focal pathologic process.

Soft tissues and spinal canal: No prevertebral fluid or swelling. No
visible canal hematoma.

Disc levels: Multilevel degenerative changes, most pronounced at
C6-7. Multilevel facet and uncovertebral arthropathy, most
pronounced at C3-4 on the right.

Upper chest: Negative.

Other: None.
IMPRESSION: 1. No acute intracranial findings.
2. No acute fracture or dislocation of the cervical spine.
3. Multilevel degenerative changes of the cervical spine, most
pronounced at C6-7.
4. Deep brain stimulator device in situ.

## 2021-09-28 IMAGING — DX DG CHEST 1V
1 series · 1 of 1 positions shown · non-contrast
Comparison: None.

CLINICAL DATA: Weakness. Suicidal ideation.

EXAM:
CHEST  1 VIEW

[chest ap]
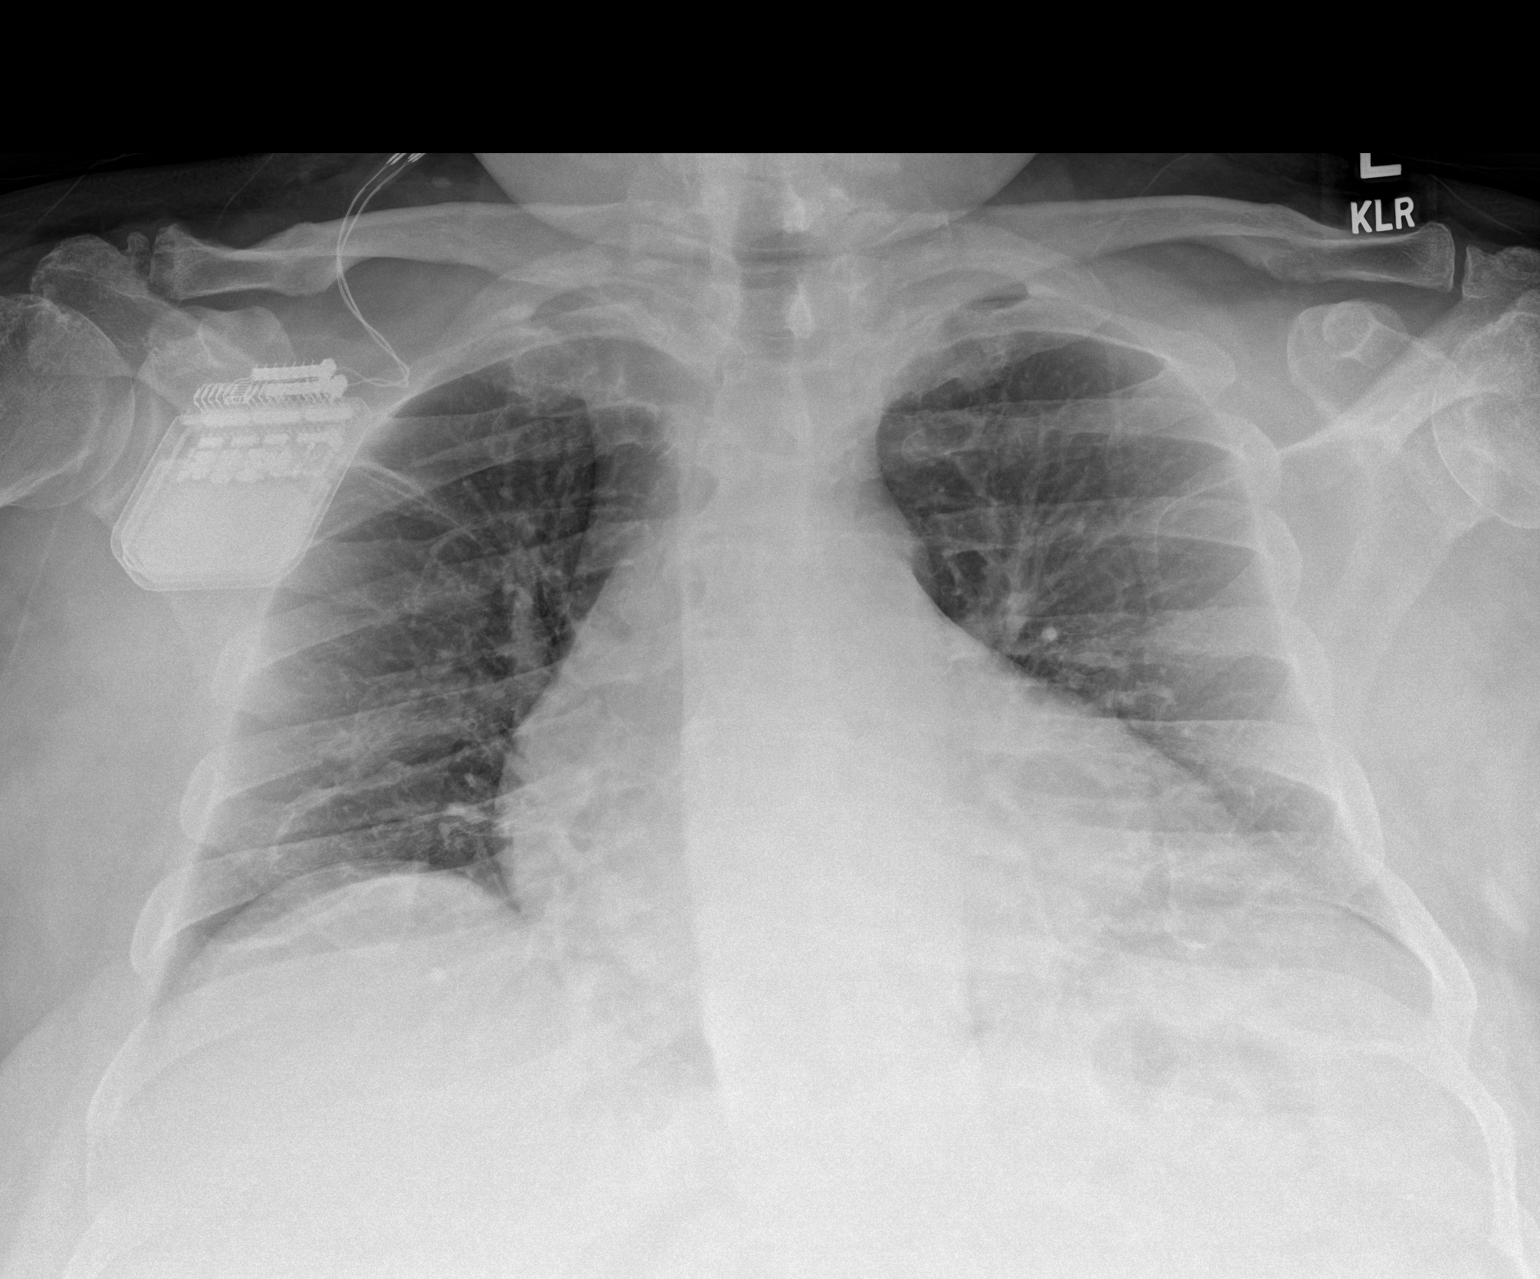

[1 of 1 positions shown; findings below may reference images not displayed]

FINDINGS: The heart size and pulmonary vascularity are normal and the lungs
are clear. No acute bone abnormality. Neurostimulator generator in
the right upper chest soft tissues.
IMPRESSION: No acute disease.
